# Patient Record
Sex: Female | Born: 1955 | Race: White | Hispanic: No | Smoking: Current every day smoker
Health system: Southern US, Community
[De-identification: ages and names within clinical notes are randomized; demographics above are authoritative.]

---

## 2013-09-24 ENCOUNTER — Emergency Department (HOSPITAL_COMMUNITY)
Admission: EM | Admit: 2013-09-24 | Discharge: 2013-09-25 | Discharge status: Against Medical Advice | Payer: Self-pay | Attending: Emergency Medicine | Admitting: Emergency Medicine

## 2013-09-24 ENCOUNTER — Encounter (HOSPITAL_COMMUNITY): Payer: Self-pay | Admitting: Emergency Medicine

## 2013-09-24 DIAGNOSIS — F17200 Nicotine dependence, unspecified, uncomplicated: Secondary | ICD-10-CM | POA: Insufficient documentation

## 2013-09-24 DIAGNOSIS — K089 Disorder of teeth and supporting structures, unspecified: Secondary | ICD-10-CM | POA: Insufficient documentation

## 2013-09-24 NOTE — ED Provider Notes (Signed)
I signed up for this patient but when I went to go see her nursing staff informed me that she decided to leave. She left after triage but before being seen by myself for dental pain.  Dorthula Matasiffany G Lupe Bonner, PA-C 09/25/13 0000

## 2013-09-24 NOTE — ED Notes (Signed)
Pt complaining of dental pain on left lower side of mouth. States "I'm going to try and get an appointment on Monday, but I just couldn't wait." Took 400 mg ibuprofen ~2030

## 2013-09-25 NOTE — ED Provider Notes (Signed)
Medical screening examination/treatment/procedure(s) were performed by non-physician practitioner and as supervising physician I was immediately available for consultation/collaboration.   Sherina Stammer, MD 09/25/13 0044 

## 2014-07-08 ENCOUNTER — Encounter (HOSPITAL_COMMUNITY): Payer: Self-pay | Admitting: Emergency Medicine

## 2014-07-08 ENCOUNTER — Emergency Department (HOSPITAL_COMMUNITY)
Admission: EM | Admit: 2014-07-08 | Discharge: 2014-07-08 | Discharge status: Against Medical Advice | Payer: Self-pay | Attending: Emergency Medicine | Admitting: Emergency Medicine

## 2014-07-08 DIAGNOSIS — W57XXXA Bitten or stung by nonvenomous insect and other nonvenomous arthropods, initial encounter: Secondary | ICD-10-CM | POA: Insufficient documentation

## 2014-07-08 DIAGNOSIS — S60569A Insect bite (nonvenomous) of unspecified hand, initial encounter: Secondary | ICD-10-CM | POA: Insufficient documentation

## 2014-07-08 DIAGNOSIS — Z720 Tobacco use: Secondary | ICD-10-CM | POA: Insufficient documentation

## 2014-07-08 DIAGNOSIS — Y9289 Other specified places as the place of occurrence of the external cause: Secondary | ICD-10-CM | POA: Insufficient documentation

## 2014-07-08 DIAGNOSIS — Y93H2 Activity, gardening and landscaping: Secondary | ICD-10-CM | POA: Insufficient documentation

## 2014-07-08 NOTE — ED Notes (Addendum)
Patient c/o pain and shaking in right hand. Red blister noted to right hand. Per patient questionable insect/spider bite from working in garden yesterday. Patient reports having similar area on knee 2 months ago after being bite by insect/spider-per patient "popped blister and it went away. Per patient numbness starting in right hand and going up arm.

## 2014-07-08 NOTE — ED Notes (Signed)
Reported from registration that pt informed them of her leaving

## 2014-07-17 ENCOUNTER — Encounter (HOSPITAL_COMMUNITY): Payer: Self-pay | Admitting: Emergency Medicine

## 2015-05-01 ENCOUNTER — Emergency Department (HOSPITAL_COMMUNITY): Payer: Self-pay

## 2015-05-01 ENCOUNTER — Encounter (HOSPITAL_COMMUNITY): Payer: Self-pay | Admitting: Cardiology

## 2015-05-01 ENCOUNTER — Emergency Department (HOSPITAL_COMMUNITY)
Admission: EM | Admit: 2015-05-01 | Discharge: 2015-05-01 | Disposition: A | Discharge status: Home / Self Care | Payer: Self-pay | Attending: Emergency Medicine | Admitting: Emergency Medicine

## 2015-05-01 DIAGNOSIS — T148XXA Other injury of unspecified body region, initial encounter: Secondary | ICD-10-CM

## 2015-05-01 DIAGNOSIS — Z79899 Other long term (current) drug therapy: Secondary | ICD-10-CM | POA: Insufficient documentation

## 2015-05-01 DIAGNOSIS — S91051A Open bite, right ankle, initial encounter: Secondary | ICD-10-CM | POA: Insufficient documentation

## 2015-05-01 DIAGNOSIS — S82402A Unspecified fracture of shaft of left fibula, initial encounter for closed fracture: Secondary | ICD-10-CM

## 2015-05-01 DIAGNOSIS — Y998 Other external cause status: Secondary | ICD-10-CM | POA: Insufficient documentation

## 2015-05-01 DIAGNOSIS — Y9389 Activity, other specified: Secondary | ICD-10-CM | POA: Insufficient documentation

## 2015-05-01 DIAGNOSIS — Y9289 Other specified places as the place of occurrence of the external cause: Secondary | ICD-10-CM | POA: Insufficient documentation

## 2015-05-01 DIAGNOSIS — W108XXA Fall (on) (from) other stairs and steps, initial encounter: Secondary | ICD-10-CM | POA: Insufficient documentation

## 2015-05-01 DIAGNOSIS — Z720 Tobacco use: Secondary | ICD-10-CM | POA: Insufficient documentation

## 2015-05-01 DIAGNOSIS — S82432A Displaced oblique fracture of shaft of left fibula, initial encounter for closed fracture: Secondary | ICD-10-CM | POA: Insufficient documentation

## 2015-05-01 DIAGNOSIS — Z23 Encounter for immunization: Secondary | ICD-10-CM | POA: Insufficient documentation

## 2015-05-01 DIAGNOSIS — W5501XA Bitten by cat, initial encounter: Secondary | ICD-10-CM | POA: Insufficient documentation

## 2015-05-01 MED ORDER — RABIES VACCINE, PCEC IM SUSR: 1.0000 mL | Freq: Once | INTRAMUSCULAR | Status: DC

## 2015-05-01 MED ORDER — AMOXICILLIN-POT CLAVULANATE 875-125 MG PO TABS
1.0000 | ORAL_TABLET | Freq: Once | ORAL | Status: AC
  Administered 2015-05-01: 1 via ORAL
  Filled 2015-05-01: qty 1

## 2015-05-01 MED ORDER — RABIES IMMUNE GLOBULIN 150 UNIT/ML IM INJ
20.0000 [IU]/kg | INJECTION | Freq: Once | INTRAMUSCULAR | Status: DC
  Filled 2015-05-01: qty 8

## 2015-05-01 MED ORDER — ONDANSETRON HCL 4 MG PO TABS
4.0000 mg | ORAL_TABLET | Freq: Once | ORAL | Status: AC
Start: 2015-05-01 — End: 2015-05-01
  Administered 2015-05-01: 4 mg via ORAL
  Filled 2015-05-01: qty 1

## 2015-05-01 MED ORDER — TETANUS-DIPHTH-ACELL PERTUSSIS 5-2.5-18.5 LF-MCG/0.5 IM SUSP
0.5000 mL | Freq: Once | INTRAMUSCULAR | Status: AC
  Administered 2015-05-01: 0.5 mL via INTRAMUSCULAR
  Filled 2015-05-01: qty 0.5

## 2015-05-01 MED ORDER — AMOXICILLIN-POT CLAVULANATE 875-125 MG PO TABS: 1.0000 | ORAL_TABLET | Freq: Two times a day (BID) | ORAL | Status: AC

## 2015-05-01 MED ORDER — HYDROCODONE-ACETAMINOPHEN 5-325 MG PO TABS: 1.0000 | ORAL_TABLET | ORAL | Status: AC | PRN

## 2015-05-01 NOTE — Discharge Instructions (Signed)
Animal Bite °An animal bite can result in a scratch on the skin, deep open cut, puncture of the skin, crush injury, or tearing away of the skin or a body part. Dogs are responsible for most animal bites. Children are bitten more often than adults. An animal bite can range from very mild to more serious. A small bite from your house pet is no cause for alarm. However, some animal bites can become infected or injure a bone or other tissue. You must seek medical care if: °· The skin is broken and bleeding does not slow down or stop after 15 minutes. °· The puncture is deep and difficult to clean (such as a cat bite). °· Pain, warmth, redness, or pus develops around the wound. °· The bite is from a stray animal or rodent. There may be a risk of rabies infection. °· The bite is from a snake, raccoon, skunk, fox, coyote, or bat. There may be a risk of rabies infection. °· The person bitten has a chronic illness such as diabetes, liver disease, or cancer, or the person takes medicine that lowers the immune system. °· There is concern about the location and severity of the bite. °It is important to clean and protect an animal bite wound right away to prevent infection. Follow these steps: °· Clean the wound with plenty of water and soap. °· Apply an antibiotic cream. °· Apply gentle pressure over the wound with a clean towel or gauze to slow or stop bleeding. °· Elevate the affected area above the heart to help stop any bleeding. °· Seek medical care. Getting medical care within 8 hours of the animal bite leads to the best possible outcome. °DIAGNOSIS  °Your caregiver will most likely: °· Take a detailed history of the animal and the bite injury. °· Perform a wound exam. °· Take your medical history. °Blood tests or X-rays may be performed. Sometimes, infected bite wounds are cultured and sent to a lab to identify the infectious bacteria.  °TREATMENT  °Medical treatment will depend on the location and type of animal bite as  well as the patient's medical history. Treatment may include: °· Wound care, such as cleaning and flushing the wound with saline solution, bandaging, and elevating the affected area. °· Antibiotics. °· Tetanus immunization. °· Rabies immunization. °· Leaving the wound open to heal. This is often done with animal bites, due to the high risk of infection. However, in certain cases, wound closure with stitches, wound adhesive, skin adhesive strips, or staples may be used. ° Infected bites that are left untreated may require intravenous (IV) antibiotics and surgical treatment in the hospital. °HOME CARE INSTRUCTIONS °· Follow your caregiver's instructions for wound care. °· Take all medicines as directed. °· If your caregiver prescribes antibiotics, take them as directed. Finish them even if you start to feel better. °· Follow up with your caregiver for further exams or immunizations as directed. °You may need a tetanus shot if: °· You cannot remember when you had your last tetanus shot. °· You have never had a tetanus shot. °· The injury broke your skin. °If you get a tetanus shot, your arm may swell, get red, and feel warm to the touch. This is common and not a problem. If you need a tetanus shot and you choose not to have one, there is a rare chance of getting tetanus. Sickness from tetanus can be serious. °SEEK MEDICAL CARE IF: °· You notice warmth, redness, soreness, swelling, pus discharge, or a bad   smell coming from the wound.  You have a red line on the skin coming from the wound.  You have a fever, chills, or a general ill feeling.  You have nausea or vomiting.  You have continued or worsening pain.  You have trouble moving the injured part.  You have other questions or concerns. MAKE SURE YOU:  Understand these instructions.  Will watch your condition.  Will get help right away if you are not doing well or get worse. Document Released: 05/20/2011 Document Revised: 11/24/2011 Document  Reviewed: 05/20/2011 [facility] Patient Information 2015 [place], [place]. This information is not intended to replace advice given to you by your health care provider. Make sure you discuss any questions you have with your health care provider.  Fibular Fracture, Ankle, Adult, Undisplaced, Treated With Immobilization A simple fracture of the bone below the knee on the outside of your leg (fibula) usually heals without problems. CAUSES Typically, a fibular fracture occurs as a result of trauma. A blow to the side of your leg or a powerful twisting movement can cause a fracture. Fibular fractures are often seen as a result of football, soccer, or skiing injuries. SYMPTOMS Symptoms of a fibular fracture can include:  Pain.  Shortening or abnormal alignment of your lower leg (angulation). DIAGNOSIS A health care provider will need to examine the leg. X-ray exams will be ordered for further to confirm the fracture and evaluate the extent and of the injury. TREATMENT  Typically, a cast or immobilizer is applied. Sometimes a splint is placed on these fractures if it is needed for comfort or if the bones are badly out of place. Crutches may be needed to help you get around.  HOME CARE INSTRUCTIONS   Apply ice to the injured area:  Put ice in a plastic bag.  Place a towel between your skin and the bag.  Leave the ice on for 20 minutes, 2-3 times a day.  Use crutches as directed. Resume walking without crutches as directed by your health care provider or when comfortable doing so.  Only take over-the-counter or prescription medicines for pain, discomfort, or fever as directed by your health care provider.  Keeping your leg raised may lessen swelling.  If you have a removable splint or boot, do not remove the boot unless directed by your health care provider.  Do not not drive a car or operate a motor vehicle until your health care provider specifically tells you it is safe to do so. SEEK  IMMEDIATE MEDICAL CARE IF:   Your cast gets damaged or breaks.  You have continued severe pain or more swelling than you did before the cast was put on, or the pain is not controlled with medications.  Your skin or nails below the injury turn blue or grey, or feel cold or numb.  There is a bad smell or pus coming from under the cast.  You develop severe pain in ankle or foot. MAKE SURE YOU:   Understand these instructions.  Will watch your condition.  Will get help right away if you are not doing well or get worse. Document Released: 05/24/2002 Document Revised: 06/22/2013 Document Reviewed: 04/13/2013 [facility] Patient Information 2015 [place], [place]. This information is not intended to replace advice given to you by your health care provider. Make sure you discuss any questions you have with your health care provider.

## 2015-05-01 NOTE — ED Notes (Addendum)
Larey Seat one month ago on some steps.  C/o pain to right ankle.  Also attacked by wild cat one Joyce ago.  Bruising and 2 superficial areas to right forearm.

## 2015-05-01 NOTE — ED Notes (Signed)
Patient refusing rabies series, Ashley Day spoke with patient in detail including rationale for rabies series as well as possible consquences for not taking series.

## 2015-05-01 NOTE — ED Provider Notes (Signed)
CSN: [id number]     Arrival date & time 05/01/15  1322 History   First MD Initiated Contact with Patient 05/01/15 1358     Chief Complaint  Patient presents with  . Foot Injury  . Animal Bite     (Consider location/radiation/quality/duration/timing/severity/associated sxs/prior Treatment) HPI Comments: The history is given by the patient and her significant other in the room.  Patient is a 59 year old female who presents to the emergency department with a chief complaint of right ankle pain, and a cat bite and scratch.  The patient states that approximately a month ago she fell down some steps and injured her left ankle. She noted swelling and pain, she treated it with home remedies, but the swelling would not seem to go down. She also continues to have pain when she puts any weight on it on. She was encouraged by her significant other and other family members to come and have it checked out. The patient has not had any previous operations or procedures involving the right ankle. The patient denies being on any anticoagulation medications, or having any bleeding disorders. She states that Tylenol and Goody powders are not helping.  The patient states that one week ago she was bitten and scratched by a stray cat. She is not familiar with the cat, and has not seen the cat since the incident. The patient states that she had increased some bruising and redness of her right forearm, and also a scratch with some redness of the right ankle. The patient is unsure of the date of her last tetanus. She's not been on any anti-biotics since the cat bite. She is unsure of the rabies status of the cat that bit her. The incident has not been reported to animal control.  The history is provided by the patient.    History reviewed. No pertinent past medical history. History reviewed. No pertinent past surgical history. History reviewed. No pertinent family history. Social History  Substance Use Topics  .  Smoking status: Current Every Day Smoker -- 1.00 packs/day for 25 years    Types: Cigarettes  . Smokeless tobacco: Never Used  . Alcohol Use: Yes     Comment: everyday  6 beers    OB History    Gravida Para Term Preterm AB TAB SAB Ectopic Multiple Living   Review of Systems  Musculoskeletal: Positive for arthralgias.  Skin: Positive for wound.  All other systems reviewed and are negative.     Allergies  Review of patient's allergies indicates no known allergies.  Home Medications   Prior to Admission medications   Medication Sig Start Date End Date Taking? Authorizing Provider  Aspirin-Salicylamide-Caffeine (BC HEADACHE POWDER PO) Take 2 packets by mouth daily.   Yes Historical Provider, MD  ibuprofen (ADVIL,MOTRIN) 200 MG tablet Take 400 mg by mouth every 6 (six) hours as needed.   Yes Historical Provider, MD   BP 141/87 mmHg  Pulse 109  Temp(Src) 98.9 F (37.2 C) (Oral)  Resp 16  Ht 5' (1.524 m)  Wt 105 lb (47.628 kg)  BMI 20.51 kg/m2  SpO2 100% Physical Exam  Constitutional: She is oriented to person, place, and time. She appears well-developed and well-nourished.  Non-toxic appearance.  HENT:  Head: Normocephalic.  Right Ear: Tympanic membrane and external ear normal.  Left Ear: Tympanic membrane and external ear normal.  Eyes: EOM and lids are normal. Pupils are equal, round, and  reactive to light.  Neck: Normal range of motion. Neck supple. Carotid bruit is not present.  Cardiovascular: Normal rate, regular rhythm, normal heart sounds, intact distal pulses and normal pulses.   Pulmonary/Chest: Breath sounds normal. No respiratory distress.  Abdominal: Soft. Bowel sounds are normal. There is no tenderness. There is no guarding.  Musculoskeletal:       Right ankle: She exhibits decreased range of motion, swelling and deformity. She exhibits no laceration. Tenderness.  Lymphadenopathy:       Head (right side): No submandibular adenopathy  present.       Head (left side): No submandibular adenopathy present.    She has no cervical adenopathy.  Neurological: She is alert and oriented to person, place, and time. She has normal strength. No cranial nerve deficit or sensory deficit.  Skin: Skin is warm and dry.  Psychiatric: She has a normal mood and affect. Her speech is normal.  Nursing note and vitals reviewed.   ED Course  Patient states that she was bitten by a cat that was wild or stray. Patient was offered anabiotic, tetanus shot, and tetanus medications. The patient refused the tetanus medications. I have explained to the patient in detail the risk involved and the possible outcomes from those risk. The patient states that she does not want to take 5 shots. She refuses the medication, and she takes responsibility for not taking those rabies medications. She states she will take the antibiotic.  Procedures (including critical care time) Labs Review Labs Reviewed - No data to display  Imaging Review Dg Ankle Complete Left  05/01/2015   CLINICAL DATA:  Fall 1 month ago, right ankle pain  EXAM: LEFT ANKLE COMPLETE - 3+ VIEW  COMPARISON:  None.  FINDINGS: Three views of the left ankle submitted. There is minimal displaced oblique healing fracture in distal left fibula with adjacent soft tissue swelling. Ankle mortise is preserved.  IMPRESSION: Minimal displaced oblique healing fracture in distal fibula with adjacent soft tissue swelling. Ankle mortise is preserved.   Electronically Signed   By: Natasha Mead M.D.   On: 05/01/2015 14:05   I have personally reviewed and evaluated these images and lab results as part of my medical decision-making.   EKG Interpretation None      MDM  Patient fell approximately a month ago on steps. She has had pain off and on during that time. Her x-ray reveals a oblique healing fracture of the distal fibula with adjacent soft tissue swelling. I have demonstrated to the patient the fracture.  Patient is referred to Dr. Hilda Lias for additional evaluation and management. Prescription for Norco given to the patient. Crutches were offered. The patient states she has a walker to use at home.   Patient states she was bitten by a stray cat. Antibiotics, tetanus, and rabies vacine offered. Patient refuses the rabies vaccine. The patient is to follow with the health department, or return to the emergency department if any changes, problems, or concerns.    Final diagnoses:  None    *I have reviewed nursing notes, vital signs, and all appropriate lab and imaging results for this patient.[address], PA-C 05/02/15 1015  Eber Hong, MD 05/02/15 [phone]

## 2016-01-27 IMAGING — DX DG ANKLE COMPLETE 3+V*L*
3 series · 3 of 3 positions shown · non-contrast
Comparison: None.

CLINICAL DATA: Fall 1 month ago, right ankle pain

EXAM:
LEFT ANKLE COMPLETE - 3+ VIEW

[ankle ap]
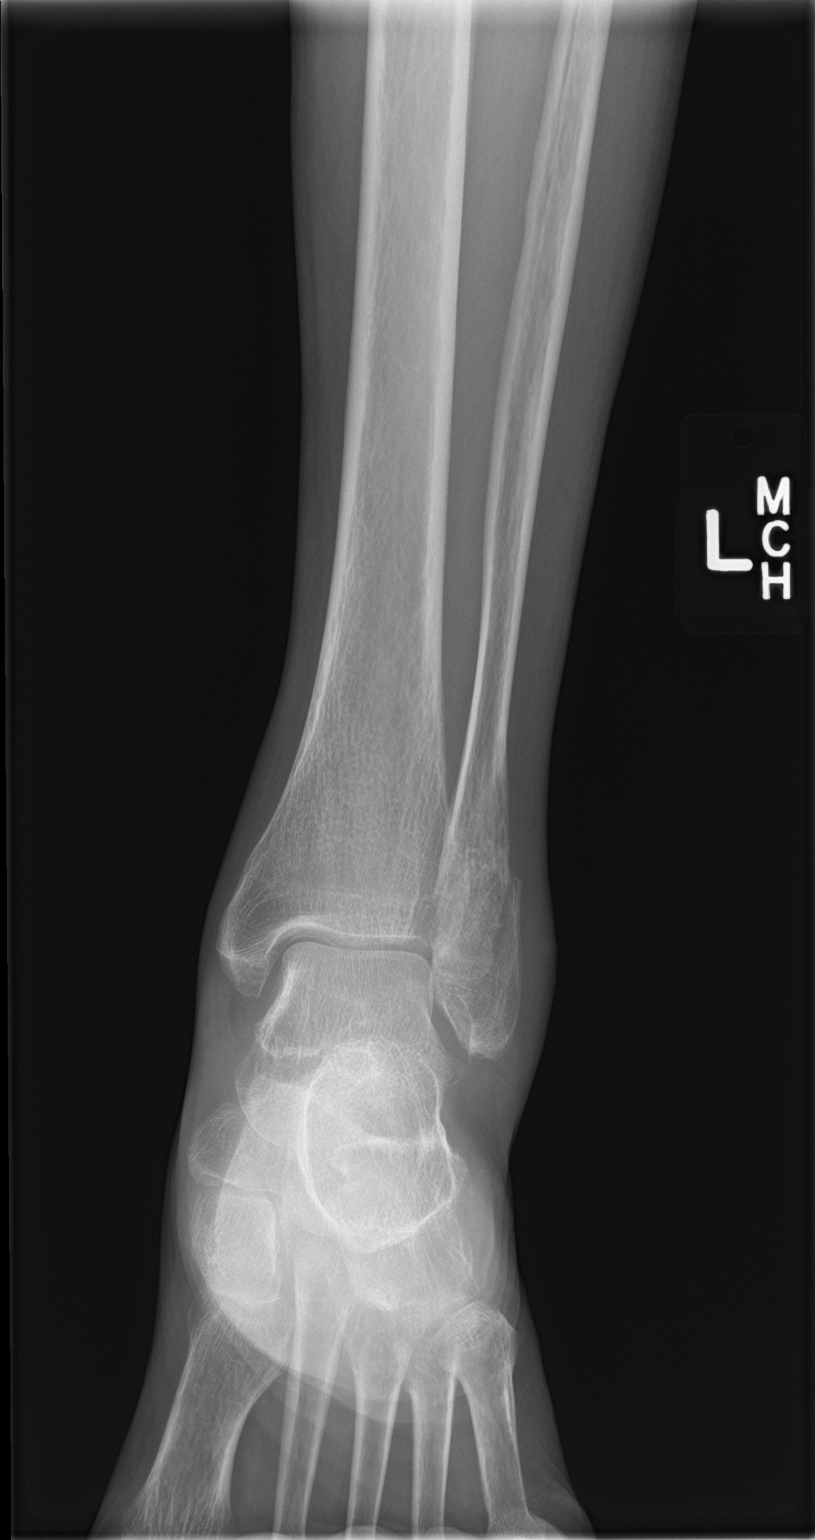

[ankle obl]
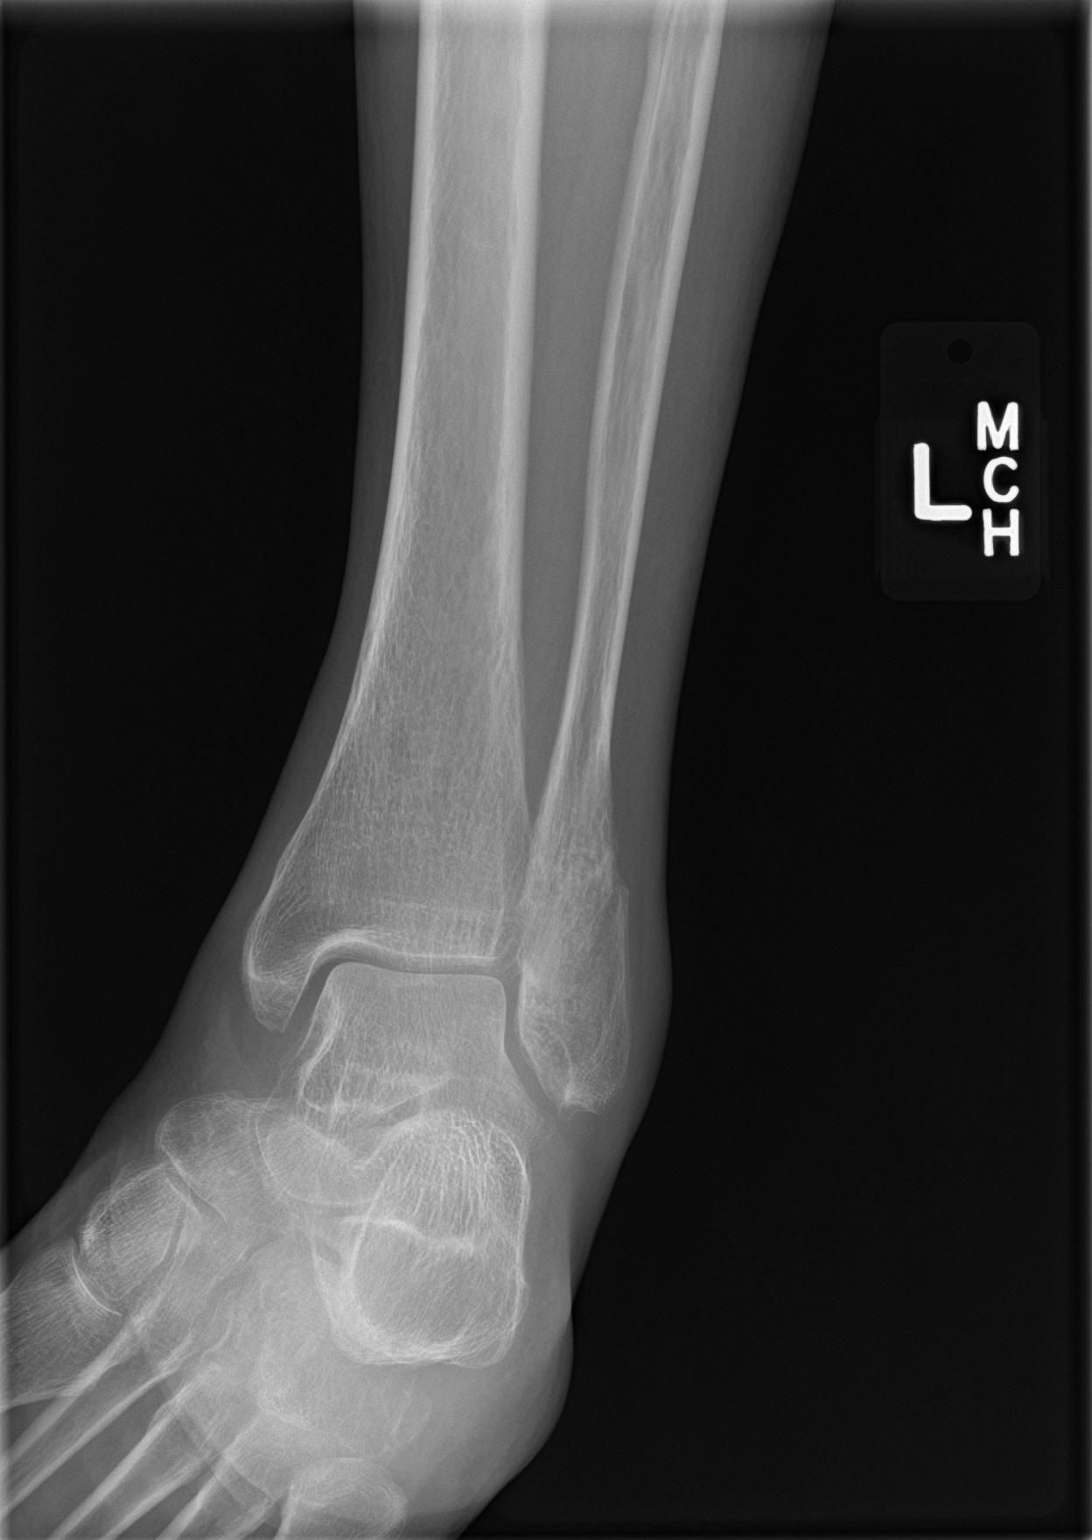

[ankle lat]
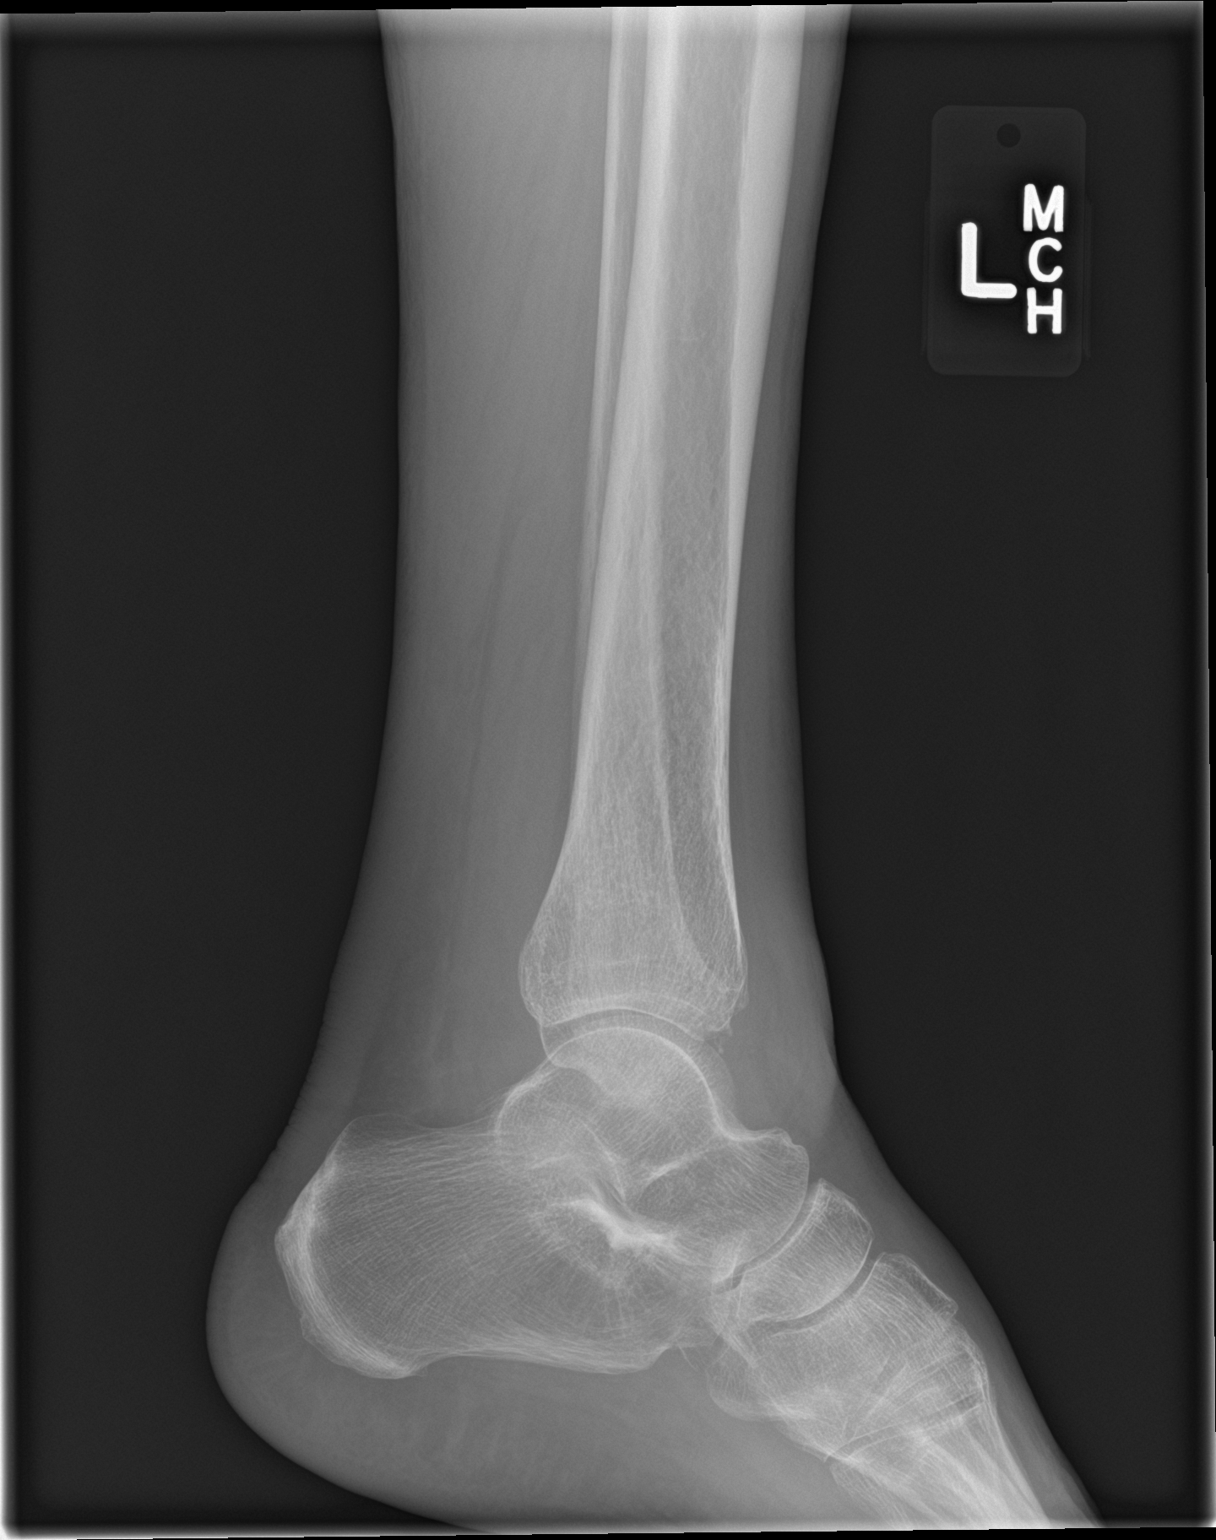

[3 of 3 positions shown; findings below may reference images not displayed]

FINDINGS: Three views of the left ankle submitted. There is minimal displaced
oblique healing fracture in distal left fibula with adjacent soft
tissue swelling. Ankle mortise is preserved.
IMPRESSION: Minimal displaced oblique healing fracture in distal fibula with
adjacent soft tissue swelling. Ankle mortise is preserved.

## 2020-01-14 DEATH — deceased
